# Patient Record
Sex: Male | Born: 1940 | Race: White | Hispanic: No | Marital: Married | Smoking: Never smoker
Health system: Southern US, Community
[De-identification: ages and names within clinical notes are randomized; demographics above are authoritative.]

## PROBLEM LIST (undated history)

## (undated) DIAGNOSIS — I4891 Unspecified atrial fibrillation: Secondary | ICD-10-CM

## (undated) DIAGNOSIS — I10 Essential (primary) hypertension: Secondary | ICD-10-CM

## (undated) DIAGNOSIS — N289 Disorder of kidney and ureter, unspecified: Secondary | ICD-10-CM

---

## 2003-10-07 ENCOUNTER — Other Ambulatory Visit: Payer: Self-pay

## 2005-08-24 ENCOUNTER — Ambulatory Visit: Payer: Self-pay

## 2005-11-07 ENCOUNTER — Emergency Department: Payer: Self-pay | Admitting: Unknown Physician Specialty

## 2007-02-01 IMAGING — CR ORBITS FOR FOREIGN BODY - 2 VIEW
1 series · 3 of 3 positions shown · non-contrast
Comparison: none

REASON FOR EXAM: Rule out metallic foreign body
COMMENTS:

[Series 1: view not recorded · 0.17mm/px · 3 of 3 slices shown]
[im 1/3]
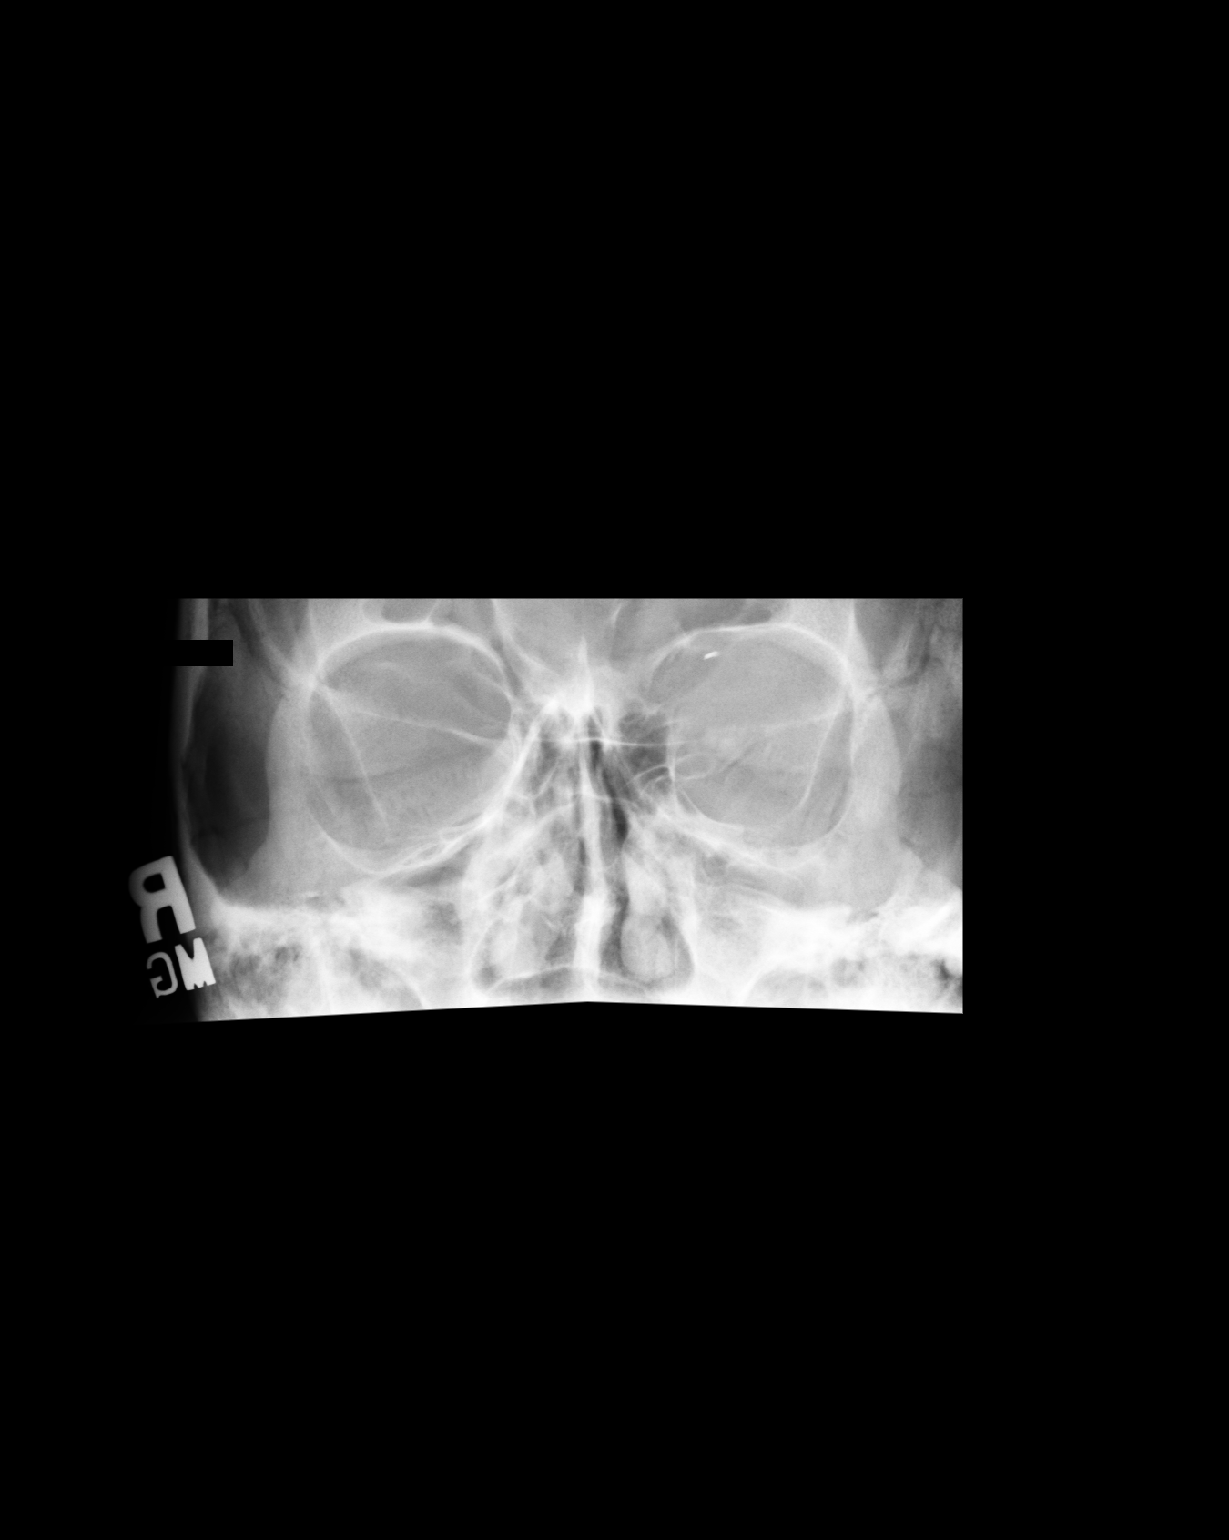
[im 2/3]
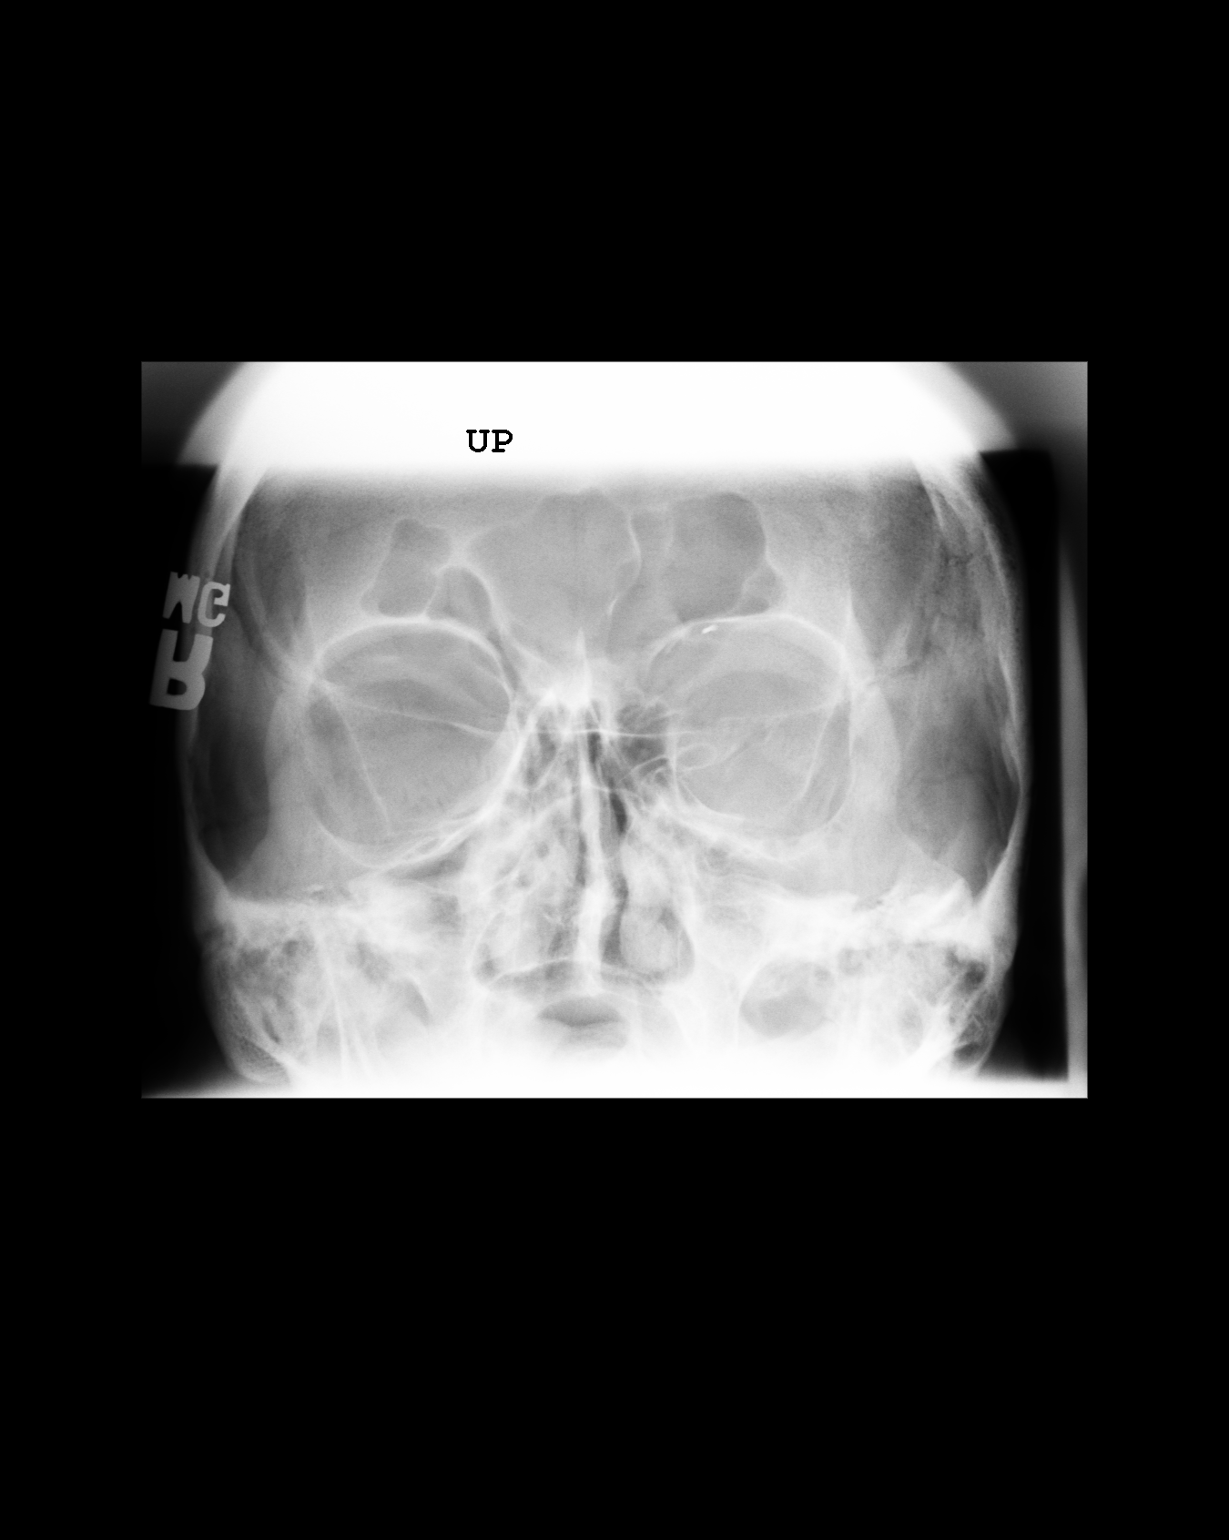
[im 3/3]
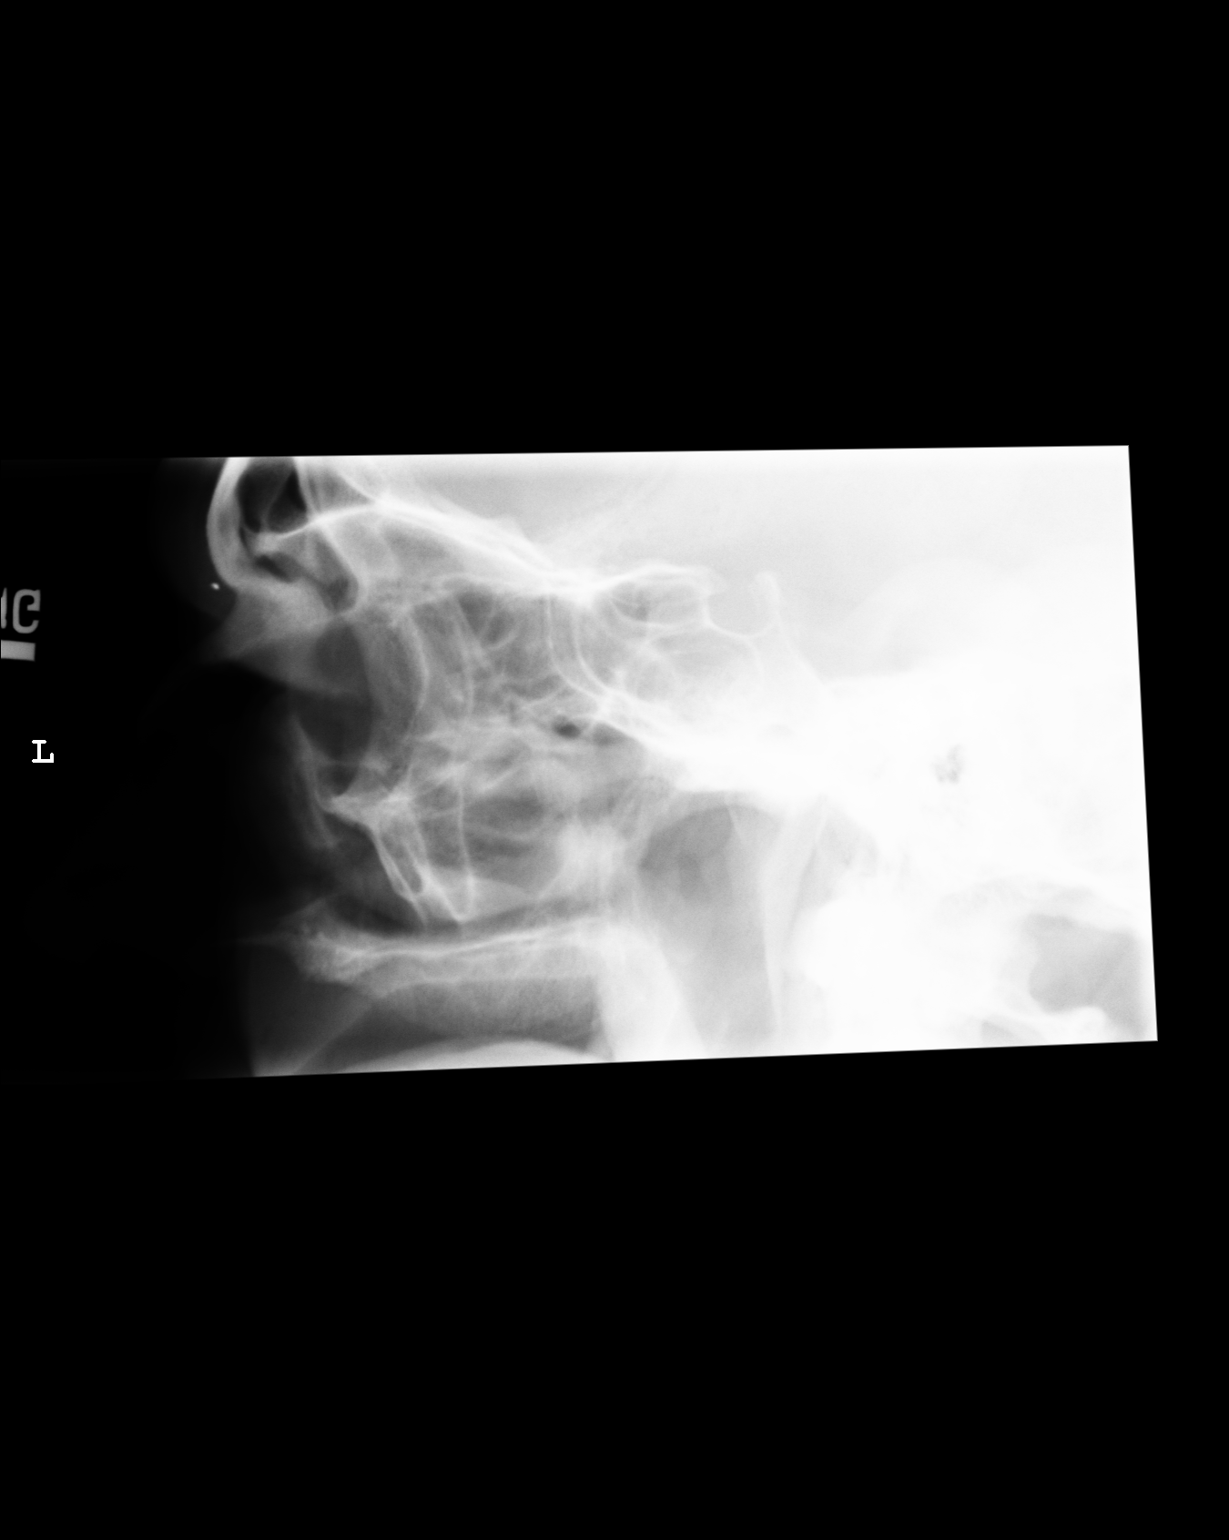

[3 of 3 positions shown; findings below may reference images not displayed]

PROCEDURE:     DXR - DXR ORBITS FOR MRI CLEARANCE  - August 24, 2005  [DATE]

RESULT:     Three views of the orbits reveal an approximately 1 x 3 mm
diameter radiodense foreign body that likely lies in the palpebral soft
tissues on the LEFT.  The patient has no knowledge of any metallic foreign
body being present.
IMPRESSION: I do not feel that the patient should undergo MRI of the knee until the
precise location of this metallic foreign body adjacent to the upper portion
of the LEFT orbit is confirmed.  This could be done via orbital noncontrast
CT scan.

## 2021-08-16 ENCOUNTER — Ambulatory Visit
Admission: EM | Admit: 2021-08-16 | Discharge: 2021-08-16 | Disposition: A | Discharge status: Home / Self Care | Payer: Medicare Other

## 2021-08-16 ENCOUNTER — Other Ambulatory Visit: Payer: Self-pay

## 2021-08-16 ENCOUNTER — Encounter: Payer: Self-pay | Admitting: Emergency Medicine

## 2021-08-16 DIAGNOSIS — L304 Erythema intertrigo: Secondary | ICD-10-CM | POA: Diagnosis not present

## 2021-08-16 DIAGNOSIS — N4883 Acquired buried penis: Secondary | ICD-10-CM

## 2021-08-16 DIAGNOSIS — N5089 Other specified disorders of the male genital organs: Secondary | ICD-10-CM

## 2021-08-16 DIAGNOSIS — L03311 Cellulitis of abdominal wall: Secondary | ICD-10-CM | POA: Diagnosis not present

## 2021-08-16 HISTORY — DX: Essential (primary) hypertension: I10

## 2021-08-16 HISTORY — DX: Disorder of kidney and ureter, unspecified: N28.9

## 2021-08-16 HISTORY — DX: Unspecified atrial fibrillation: I48.91

## 2021-08-16 NOTE — ED Notes (Signed)
Patient is being discharged from the Urgent Care and sent to the Emergency Department via private vehicle . Per B.Michiel Cowboy, [place], patient is in need of higher level of care due to infection. Patient is aware and verbalizes understanding of plan of care.  Vitals:   08/16/21 0958  BP: 140/70  Pulse: 86  Resp: 20  Temp: 98.1 F (36.7 C)  SpO2: 96%

## 2021-08-16 NOTE — ED Triage Notes (Signed)
Pt has red, raised rash on his lower abdomen and in his groin. Started about 2 days ago. He denies itching or burning.

## 2021-08-16 NOTE — ED Provider Notes (Signed)
[facility]    CSN: [id number] Arrival date & time: 08/16/21  0901      History   Chief Complaint Chief Complaint  Patient presents with   Rash    HPI Jesse Farrell is a 80 y.o. male presenting for rash of the abdomen and groin region that he noticed about 2 days ago.  He says he gets a rash on his abdomen from time to time.  He says the rash does not burn or itch.  Denies any associated pain.  He has not treated this in any way yet himself.  He denies any other complaints.  He denies any fevers, dysuria, frequency or urgency.  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  He does have a urologist but has not seen them since 2019.  Additionally he says that he has scrotal swelling but his scrotum is always swollen.  He denies any scrotal pain.  Patient denies any abdominal pain.  He does report that he fell a few days ago and has bruising on his left upper side.  She denies any breathing difficulty or cough.  He does have a history of A. fib, kidney disease and hypertension.  Patient is anticoagulated with warfarin.  He denies any other concerns.  HPI  Past Medical History:  Diagnosis Date   Atrial fibrillation (HCC)    Hypertension    Renal disorder     There are no problems to display for this patient.   History reviewed. No pertinent surgical history.     Home Medications    Prior to Admission medications   Medication Sig Start Date End Date Taking? Authorizing Provider  amLODipine (NORVASC) 5 MG tablet Take 5 mg by mouth daily. 06/06/21  Yes [provider]  aspirin 81 MG EC tablet Take by mouth. 11/25/13  Yes [provider]  atorvastatin (LIPITOR) 40 MG tablet Take 40 mg by mouth daily. 06/06/21  Yes [provider]  benazepril (LOTENSIN) 40 MG tablet Take 40 mg by mouth daily. 05/25/21  Yes [provider]  famotidine (PEPCID) 40 MG tablet Take 40 mg by mouth at bedtime as needed.  08/09/21  Yes [provider]  metoprolol succinate (TOPROL-XL) 50 MG 24 hr tablet Take 50 mg by mouth daily. 05/25/21  Yes [provider]  warfarin (COUMADIN) 4 MG tablet Take 4 mg by mouth daily. 08/03/21  Yes [provider]    Family History No family history on file.  Social History Social History   Tobacco Use   Smoking status: Never   Smokeless tobacco: Never  Vaping Use   Vaping Use: Never used  Substance Use Topics   Alcohol use: Not Currently   Drug use: Not Currently     Allergies   Patient has no known allergies.   Review of Systems Review of Systems  Constitutional:  Negative for fatigue and fever.  Respiratory:  Negative for shortness of breath.   Cardiovascular:  Negative for chest pain and palpitations.  Gastrointestinal:  Negative for abdominal pain and vomiting.  Genitourinary:  Positive for scrotal swelling (chronic). Negative for dysuria, penile discharge and penile pain.  Musculoskeletal:  Negative for arthralgias and myalgias.  Skin:  Positive for color change and rash. Negative for wound.  Neurological:  Negative for weakness.  Hematological:  Bruises/bleeds easily.    Physical Exam Triage Vital Signs ED Triage Vitals  Enc Vitals Group     BP 08/16/21  0958 140/70     Pulse Rate 08/16/21 0958 86     Resp 08/16/21 0958 20     Temp 08/16/21 0958 98.1 F (36.7 C)     Temp Source 08/16/21 0958 Oral     SpO2 08/16/21 0958 96 %     Weight 08/16/21 0954 300 lb (136.1 kg)     Height 08/16/21 0954 5\' 9"  (1.753 m)     Head Circumference --      Peak Flow --      Pain Score 08/16/21 0952 0     Pain Loc --      Pain Edu? --      Excl. in GC? --    No data found.  Updated Vital Signs BP 140/70 (BP Location: Right Arm)   Pulse 86   Temp 98.1 F (36.7 C) (Oral)   Resp 20   Ht 5\' 9"  (1.753 m)   Wt 300 lb (136.1 kg)   SpO2 96%   BMI 44.30 kg/m      Physical Exam Vitals and nursing note reviewed.  Constitutional:       General: He is not in acute distress.    Appearance: Normal appearance. He is well-developed. He is obese. He is not ill-appearing.  HENT:     Head: Normocephalic and atraumatic.  Eyes:     General: No scleral icterus.    Conjunctiva/sclera: Conjunctivae normal.  Cardiovascular:     Rate and Rhythm: Normal rate. Rhythm irregular.  Pulmonary:     Effort: Pulmonary effort is normal. No respiratory distress.     Breath sounds: Wheezing (wheezes throughout) present.  Abdominal:     General: Abdomen is protuberant.     Palpations: Abdomen is soft.     Comments: Contusion of LUQ. There is an erythematous macular, warm rash with petechiae diffusely of abdomen  Genitourinary:    Testes:        Right: Tenderness and swelling (significant swelling, erythema, warmth of scrotum) present.        Left: Tenderness and swelling present.     Comments: Significantly buried penis with 2 open wounds on either side. Malodorous. Light yellow drainage around glans Musculoskeletal:     Cervical back: Neck supple.  Skin:    General: Skin is warm and dry.  Neurological:     General: No focal deficit present.     Mental Status: He is alert. Mental status is at baseline.     Motor: No weakness.     Gait: Gait normal.  Psychiatric:        Mood and Affect: Mood normal.        Behavior: Behavior normal.        Thought Content: Thought content normal.     UC Treatments / Results  Labs (all labs ordered are listed, but only abnormal results are displayed) Labs Reviewed - No data to display  EKG   Radiology No results found.  Procedures Procedures (including critical care time)  Medications Ordered in UC Medications - No data to display  Initial Impression / Assessment and Plan / UC Course  I have reviewed the triage vital signs and the nursing notes.  Pertinent labs & imaging results that were available during my care of the patient were reviewed by me and considered in my medical  decision making (see chart for details).   80 y/o male presenting for rash of abdomen and groin x 2 days.   VSS. On exam, patient found to  have erythematous, warm, splotchy, macular rash with areas of petechia on abdomen, erythema and swelling as well as increased warmth of scrotum.No abdominal tenderness. Ecchymosis of LUQ. Additionally he has intertrigo rash of groin region, significantly buried penis. There is much odor noted as well. +wheezing throughout lungs mildly but no breathing difficulty.  Based on patient's exam, there is much concern for cellulitis of abdomen vs rash related to abnormal PT/INR since he is on coumadin. As mentioned above he has significantly buried penis and has not seen urology in 3 years. Significant odor present. Patient may likely need another surgery for this. Also he has significantly swollen and erythematous scrotum concerning for infection. Advised patient he needs a higher level of care at this time with lab work and likely imaging, possibly CT of abdomen (recently had fall and is on coumadin), and [place] of scrotum. Possible admission as well based on presentation today. Patient agreeable to plan.  Final Clinical Impressions(s) / UC Diagnoses   Final diagnoses:  Cellulitis of abdominal wall  Intertrigo  Acquired buried penis  Scrotal swelling     Discharge Instructions      You have been advised to follow up immediately in the emergency department for concerning signs.symptoms. If you declined EMS transport, please have a family member take you directly to the ED at this time. Do not delay. Based on concerns about condition, if you do not follow up in th e ED, you may risk poor outcomes including worsening of condition, delayed treatment and potentially life threatening issues. If you have declined to go to the ED at this time, you should call your PCP immediately to set up a follow up appointment.  Go to ED for red flag symptoms, including; fevers you cannot  reduce with Tylenol/Motrin, severe headaches, vision changes, numbness/weakness in part of the body, lethargy, confusion, intractable vomiting, severe dehydration, chest pain, breathing difficulty, severe persistent abdominal or pelvic pain, signs of severe infection (increased redness, swelling of an area), feeling faint or passing out, dizziness, etc. You should especially go to the ED for sudden acute worsening of condition if you do not elect to go at this time.      ED Prescriptions   None    PDMP not reviewed this encounter.   Shirlee Latch, PA-C 08/19/21 [phone]

## 2021-08-16 NOTE — Discharge Instructions (Signed)

## 2023-10-05 DEATH — deceased
